# Patient Record
Sex: Male | Born: 1995 | Race: Black or African American | Hispanic: No | Marital: Single | Smoking: Never smoker
Health system: Southern US, Community
[De-identification: ages and names within clinical notes are randomized; demographics above are authoritative.]

---
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
## 2015-07-03 ENCOUNTER — Encounter (HOSPITAL_COMMUNITY): Payer: Self-pay | Admitting: Emergency Medicine

## 2015-07-03 ENCOUNTER — Emergency Department (HOSPITAL_COMMUNITY)
Admission: EM | Admit: 2015-07-03 | Discharge: 2015-07-04 | Disposition: A | Discharge status: Home / Self Care | Payer: BLUE CROSS/BLUE SHIELD | Attending: Emergency Medicine | Admitting: Emergency Medicine

## 2015-07-03 DIAGNOSIS — S99922A Unspecified injury of left foot, initial encounter: Secondary | ICD-10-CM | POA: Diagnosis present

## 2015-07-03 DIAGNOSIS — Y92310 Basketball court as the place of occurrence of the external cause: Secondary | ICD-10-CM | POA: Diagnosis not present

## 2015-07-03 DIAGNOSIS — Y998 Other external cause status: Secondary | ICD-10-CM | POA: Diagnosis not present

## 2015-07-03 DIAGNOSIS — Z79899 Other long term (current) drug therapy: Secondary | ICD-10-CM | POA: Insufficient documentation

## 2015-07-03 DIAGNOSIS — W228XXA Striking against or struck by other objects, initial encounter: Secondary | ICD-10-CM | POA: Diagnosis not present

## 2015-07-03 DIAGNOSIS — Y9367 Activity, basketball: Secondary | ICD-10-CM | POA: Diagnosis not present

## 2015-07-03 DIAGNOSIS — S91202A Unspecified open wound of left great toe with damage to nail, initial encounter: Secondary | ICD-10-CM | POA: Diagnosis not present

## 2015-07-03 NOTE — ED Notes (Signed)
Pt sts he kicked a weight and bent his toenail backwards to L foot. Bleeding controlled.

## 2015-07-04 ENCOUNTER — Emergency Department (HOSPITAL_COMMUNITY): Payer: BLUE CROSS/BLUE SHIELD

## 2015-07-04 MED ORDER — BACITRACIN ZINC 500 UNIT/GM EX OINT: 1.0000 "application " | TOPICAL_OINTMENT | Freq: Two times a day (BID) | CUTANEOUS | Status: AC

## 2015-07-04 MED ORDER — IBUPROFEN 800 MG PO TABS: 800.0000 mg | ORAL_TABLET | Freq: Three times a day (TID) | ORAL | Status: AC

## 2015-07-04 MED ORDER — LIDOCAINE HCL (PF) 1 % IJ SOLN
30.0000 mL | Freq: Once | INTRAMUSCULAR | Status: AC
  Administered 2015-07-04: 30 mL
  Filled 2015-07-04: qty 30

## 2015-07-04 NOTE — Discharge Instructions (Signed)
Take your medications as prescribed. Keep wound clean using antibacterial soap and water, pat dry and apply a small amount of bacitracin ointment to wound daily. I also recommend elevating your foot and applying ice for 15-20 minutes 3-4 times daily to help with pain and swelling. Return to the emergency department if symptoms worsen or new onset of fever, redness, swelling, drainage, numbness, tingling, weakness.

## 2015-07-04 NOTE — ED Provider Notes (Signed)
CSN: [id number]     Arrival date & time 07/03/15  2343 History   First MD Initiated Contact with Patient 07/04/15 0006     Chief Complaint  Patient presents with  . Foot Injury     (Consider location/radiation/quality/duration/timing/severity/associated sxs/prior Treatment) HPI   Patient is a 20 year old male with no pertinent past medical history who presents to the ED with injury to left great toe, onset prior to arrival. Patient reports he was playing basketball when he kicked a metal bar on the Court resulting in him bending his left great toenail backwards. Patient endorses having associated pain to left digits 1-3. Endorses small amount of bleeding from left big toenail which she notes is now controlled. Denies numbness, tingling, weakness. Patient denies taking any medications prior to arrival. Tetanus up-to-date.  History reviewed. No pertinent past medical history. History reviewed. No pertinent past surgical history. No family history on file. Social History  Substance Use Topics  . Smoking status: Never Smoker   . Smokeless tobacco: None  . Alcohol Use: No    Review of Systems  Musculoskeletal: Positive for arthralgias (left great toe).  Skin: Positive for wound.  Neurological: Negative for weakness and numbness.      Allergies  Peanut-containing drug products  Home Medications   Prior to Admission medications   Medication Sig Start Date End Date Taking? Authorizing Provider  bacitracin ointment Apply 1 application topically 2 (two) times daily. 07/04/15   Barrett Henle, PA-C  EPINEPHrine 0.3 mg/0.3 mL IJ SOAJ injection Inject 0.3 mg into the muscle daily as needed (allergic reaction).    Historical Provider, MD  Escitalopram Oxalate (LEXAPRO PO) Take 1 tablet by mouth daily.    Historical Provider, MD  ibuprofen (ADVIL,MOTRIN) 800 MG tablet Take 1 tablet (800 mg total) by mouth 3 (three) times daily. 07/04/15   Barrett Henle, PA-C  loratadine  (CLARITIN) 10 MG tablet Take 10 mg by mouth daily.    Historical Provider, MD  LORazepam (ATIVAN) 0.5 MG tablet Take 1 tablet (0.5 mg total) by mouth every 8 (eight) hours as needed for anxiety. Patient not taking: Reported on 08/05/2014 05/10/14   Marlon Pel, PA-C   BP 138/86 mmHg  Pulse 103  Temp(Src) 98.7 F (37.1 C) (Oral)  Resp 16  Ht  (1.854 m)  Wt 77.111 kg  BMI 22.43 kg/m2  SpO2 97% Physical Exam  Constitutional: He is oriented to person, place, and time. He appears well-developed and well-nourished.  HENT:  Head: Normocephalic and atraumatic.  Eyes: Conjunctivae and EOM are normal. Right eye exhibits no discharge. Left eye exhibits no discharge. No scleral icterus.  Neck: Normal range of motion. Neck supple.  Cardiovascular: Normal rate.   HR 92  Pulmonary/Chest: Effort normal.  Musculoskeletal:  TTP over left 1st toe, FROM of left ankle, foot and toes. Sensation intact. 2+ DP pulses. Cap refill <2.   Neurological: He is alert and oriented to person, place, and time.  Skin: Skin is warm and dry.  Partially avulsed left 1st toenail with dried blood present under avulsed nail, no active bleeding.  Nursing note and vitals reviewed.   ED Course  .Nerve Block Date/Time: 07/04/2015 1:23 AM Performed by: Barrett Henle Authorized by: Barrett Henle Consent: Verbal consent obtained. Risks and benefits: risks, benefits and alternatives were discussed Consent given by: patient Patient understanding: patient states understanding of the procedure being performed Patient identity confirmed: verbally with patient Indications: debridement Body area: lower extremity Nerve: digital  Laterality: left Preparation: Patient was prepped and draped in the usual sterile fashion. Patient position: sitting Needle gauge: 27 G Location technique: anatomical landmarks Local anesthetic: lidocaine 1% without epinephrine Anesthetic total: 3 ml Outcome: pain  improved Patient tolerance: Patient tolerated the procedure well with no immediate complications   (including critical care time) Labs Review Labs Reviewed - No data to display  Imaging Review Dg Toe Great Left  07/04/2015  CLINICAL DATA:  LEFT great toe pain after kicking metal weight during basketball practice tonight. EXAM: LEFT GREAT TOE COMPARISON:  None. FINDINGS: There is no evidence of fracture or dislocation. There is no evidence of arthropathy or other focal bone abnormality. Superiorly directed great toenail. No subcutaneous gas or radiopaque foreign bodies. IMPRESSION: No acute fracture deformity or dislocation. Electronically Signed   By: Awilda Metroourtnay  Bloomer M.D.   On: 07/04/2015 00:50   I have personally reviewed and evaluated these images and lab results as part of my medical decision-making.   EKG Interpretation None      MDM   Final diagnoses:  Toe injury, left, initial encounter    Patient presents with left great toe injury after kicking a metal bar while playing basketball prior to arrival. Tetanus up-to-date. VSS. Exam revealed partially avulsed left first toenail with dried blood present under nail, no active bleeding, mild tenderness over left first toe, left foot otherwise neurovascularly intact. Left great toe x-ray negative. Digital block performed without any complications. Wound cleaned and irrigated, bacitracin ointment applied to wound. Dressing applied to toe with remaining toenail intact. Discussed wound care and symptomatically treatment with patient.  Evaluation does not show pathology requring ongoing emergent intervention or admission. Pt is hemodynamically stable and mentating appropriately. Discussed findings/results and plan with patient/guardian, who agrees with plan. All questions answered. Return precautions discussed and outpatient follow up given.      Satira Sarkicole Elizabeth CastellaNadeau, [place] JerseyPA-C 07/04/15 0125  Tomasita CrumbleAdeleke Oni, MD 07/04/15 [id number]

## 2016-08-14 IMAGING — DX DG TOE GREAT 2+V*L*
3 series · 3 of 3 positions shown · non-contrast
Comparison: None.

CLINICAL DATA: LEFT great toe pain after kicking metal weight
during basketball practice tonight.

EXAM:
LEFT GREAT TOE

[toe ap]
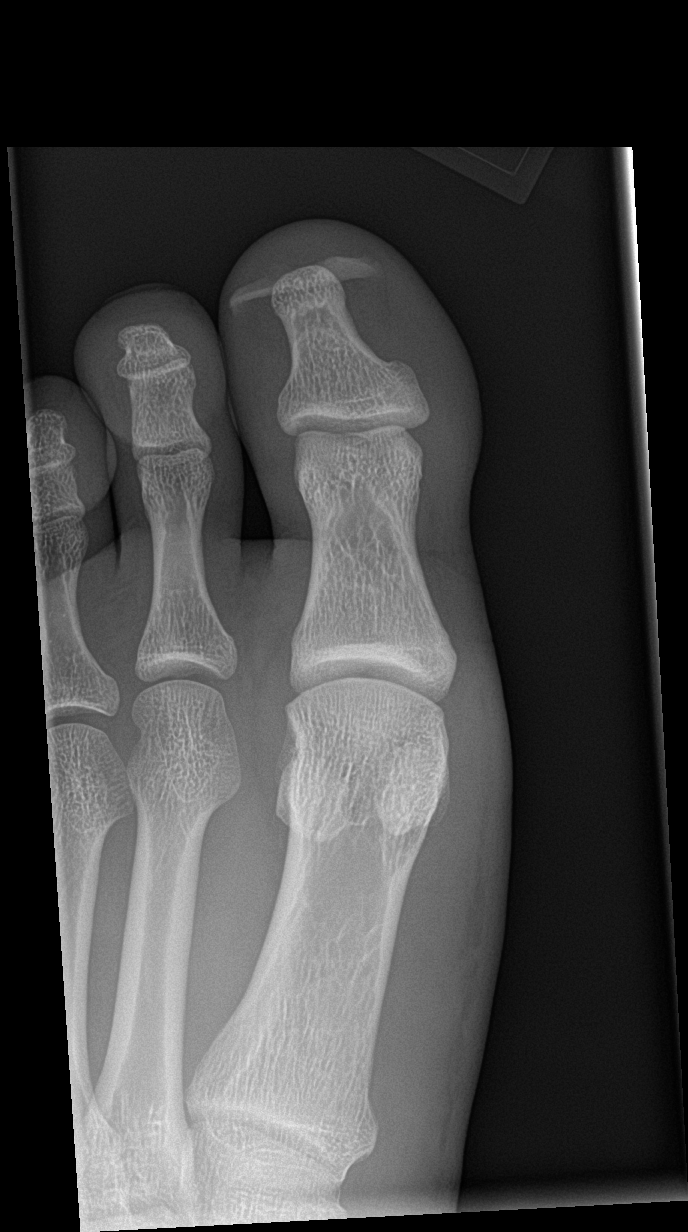

[toe obl]
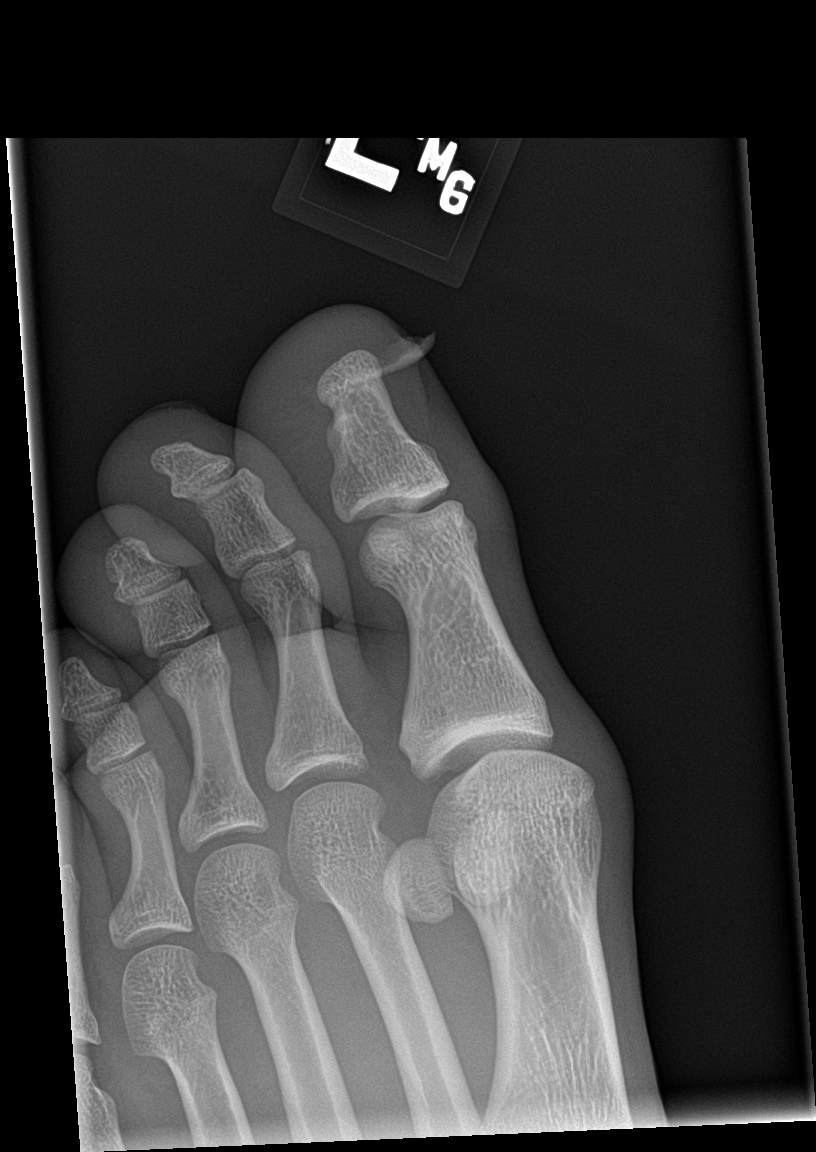

[toe lat]
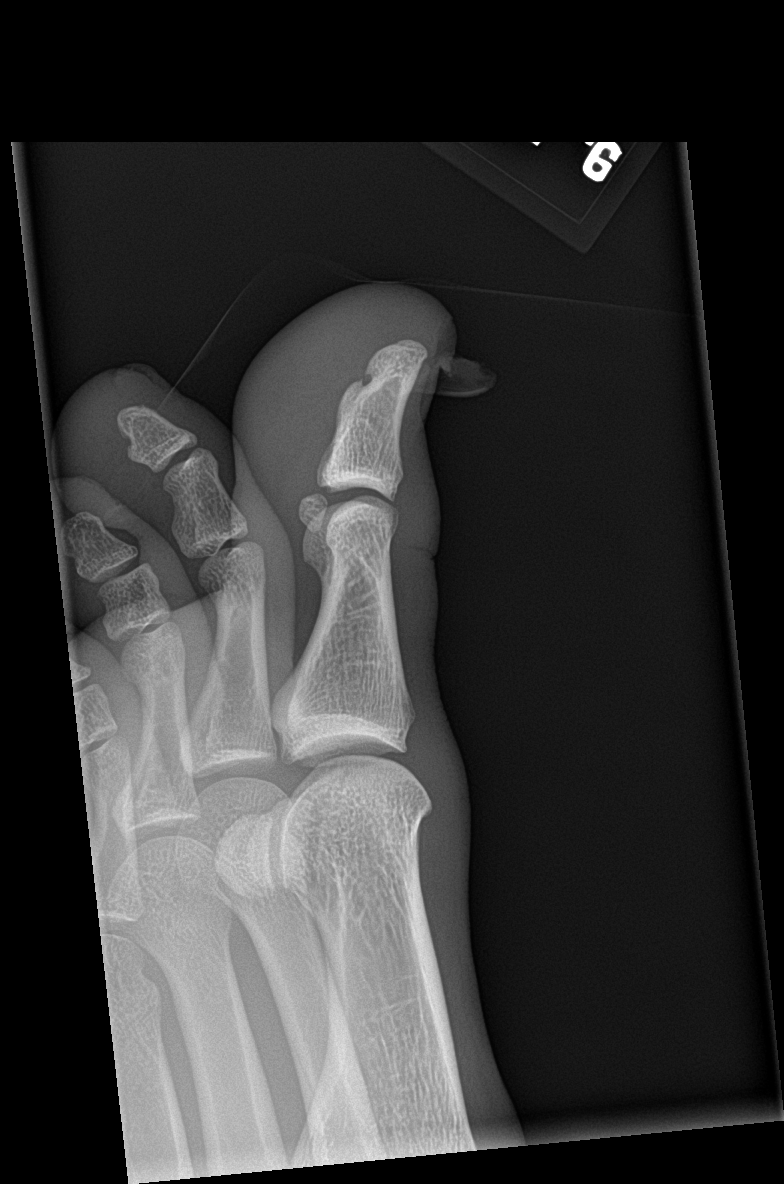

[3 of 3 positions shown; findings below may reference images not displayed]

FINDINGS: There is no evidence of fracture or dislocation. There is no
evidence of arthropathy or other focal bone abnormality. Superiorly
directed great toenail. No subcutaneous gas or radiopaque foreign
bodies.
IMPRESSION: No acute fracture deformity or dislocation.
